# Patient Record
Sex: Female | Born: 1970 | Race: White | Hispanic: No | Marital: Married | Smoking: Never smoker
Health system: Southern US, Community
[De-identification: ages and names within clinical notes are randomized; demographics above are authoritative.]

---

## 2007-09-13 ENCOUNTER — Ambulatory Visit: Payer: Self-pay | Admitting: Internal Medicine

## 2015-05-09 ENCOUNTER — Other Ambulatory Visit: Payer: Self-pay | Admitting: Specialist

## 2015-05-22 ENCOUNTER — Other Ambulatory Visit: Payer: Self-pay

## 2015-05-22 ENCOUNTER — Ambulatory Visit: Payer: Self-pay

## 2015-05-25 ENCOUNTER — Ambulatory Visit
Admission: RE | Admit: 2015-05-25 | Discharge: 2015-05-25 | Disposition: A | Discharge status: Home / Self Care | Payer: BC Managed Care – PPO | Source: Ambulatory Visit | Attending: Specialist | Admitting: Specialist

## 2016-06-27 IMAGING — US US ABDOMEN LIMITED
1 series · 14 of 25 positions shown · non-contrast
Comparison: None.

CLINICAL DATA: Morbid obesity, evaluate gallbladder

EXAM:
US ABDOMEN LIMITED - RIGHT UPPER QUADRANT

[Series 1: us abdomen limited · 0.22mm/px · 14 of 48 slices shown]
[im 1/48]
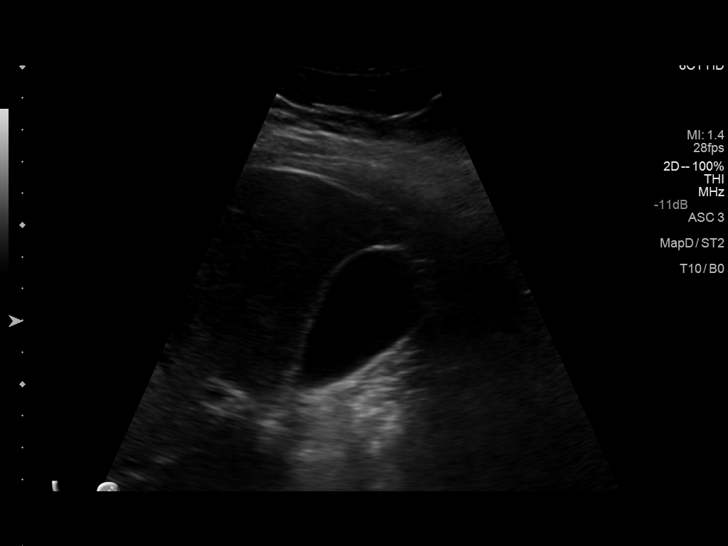
[im 4/48]
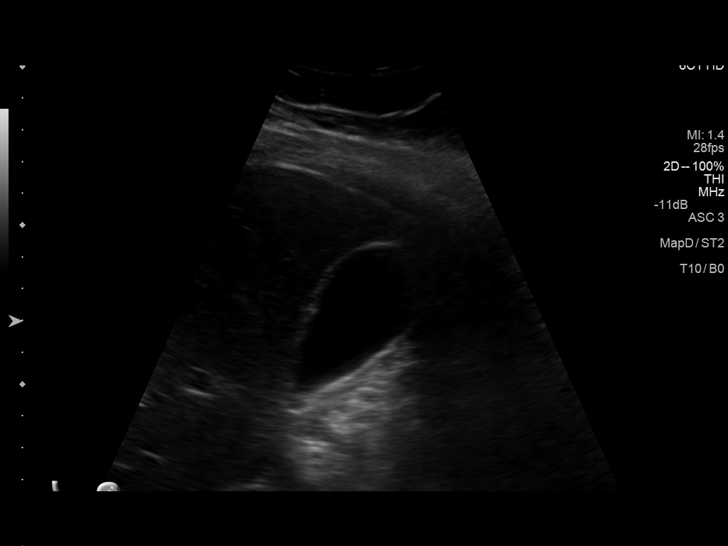
[im 8/48]
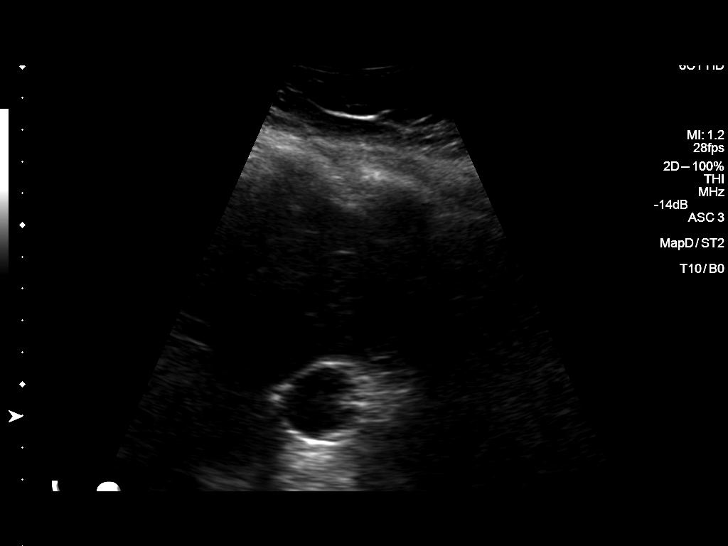
[im 12/48]
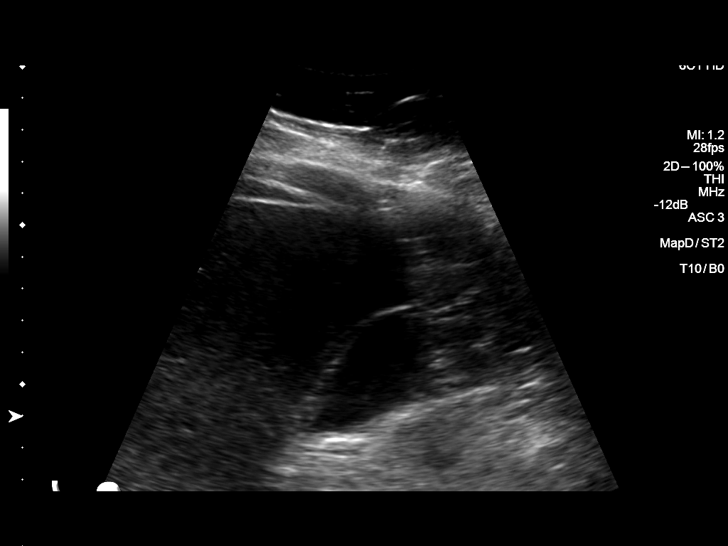
[im 16/48]
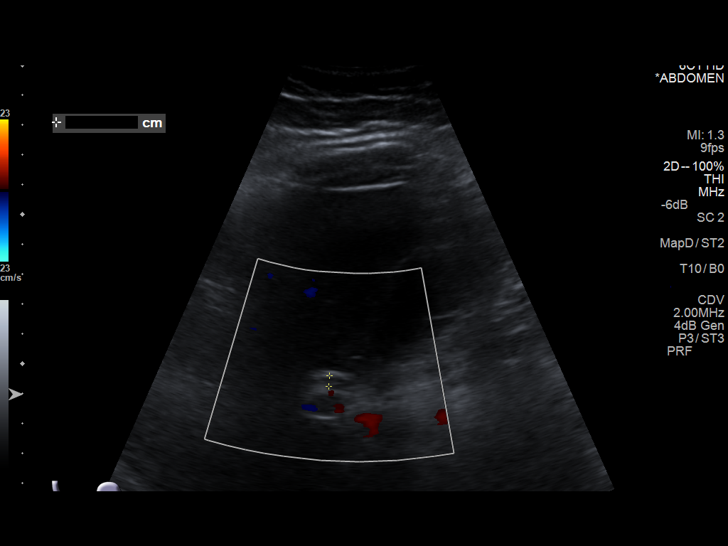
[im 18/48]
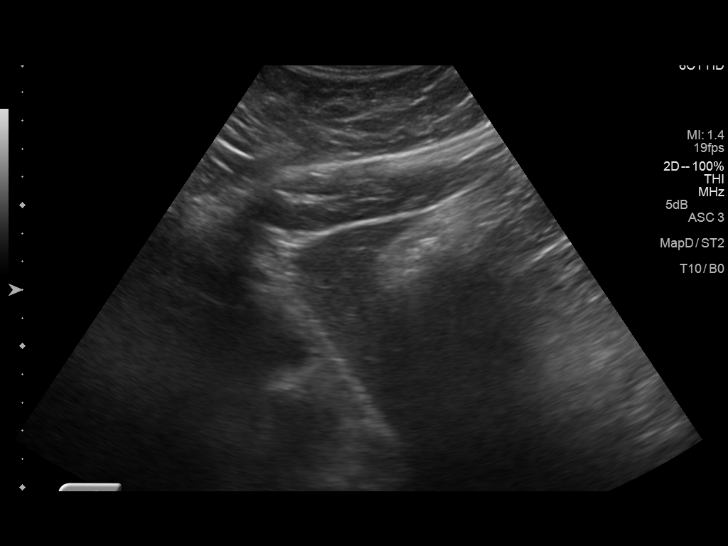
[im 22/48]
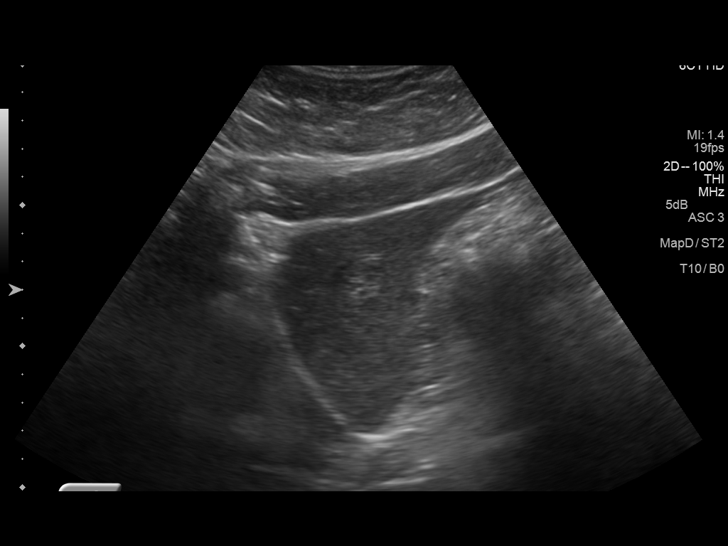
[im 26/48]
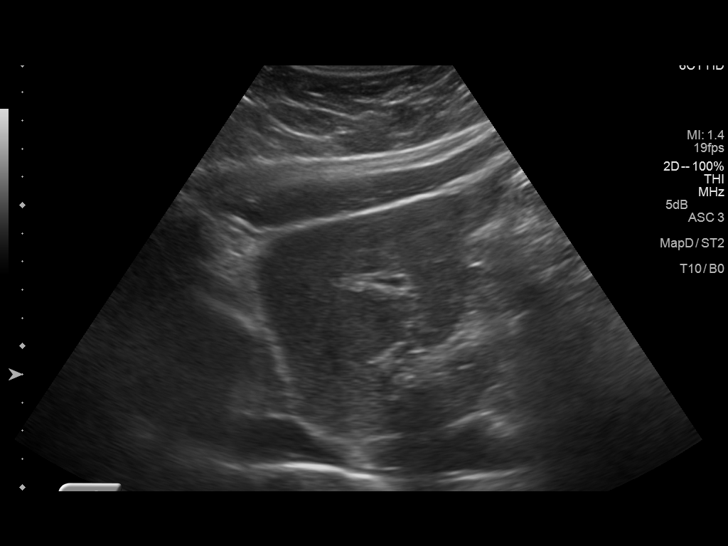
[im 30/48]
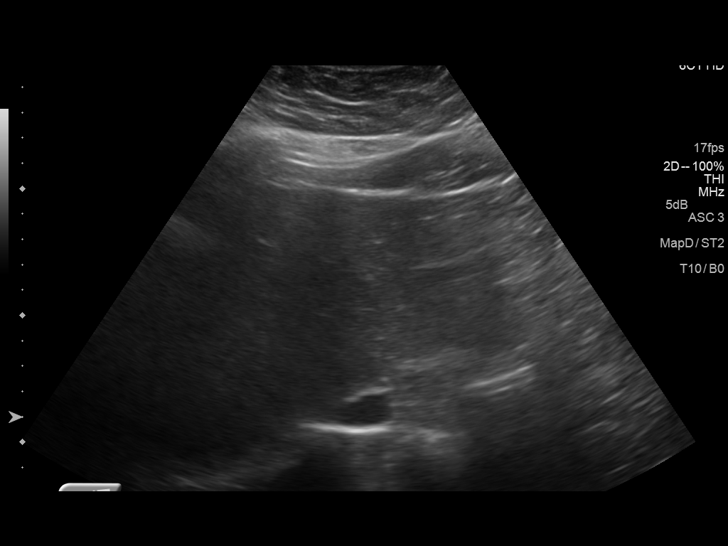
[im 32/48]
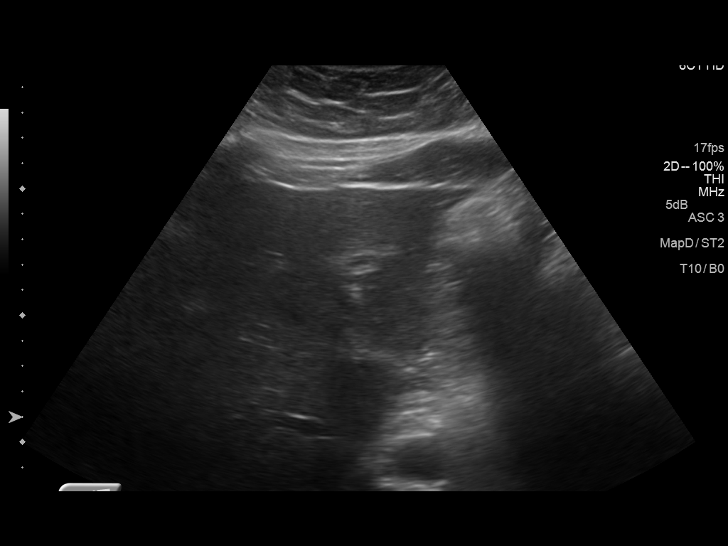
[im 36/48]
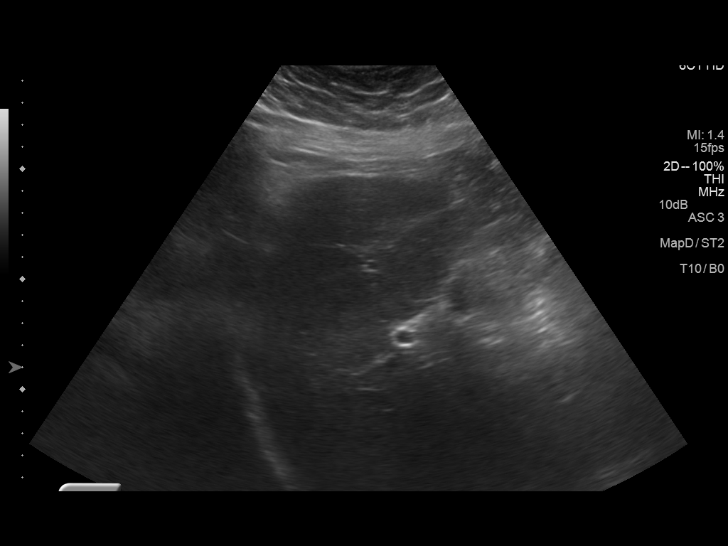
[im 40/48]
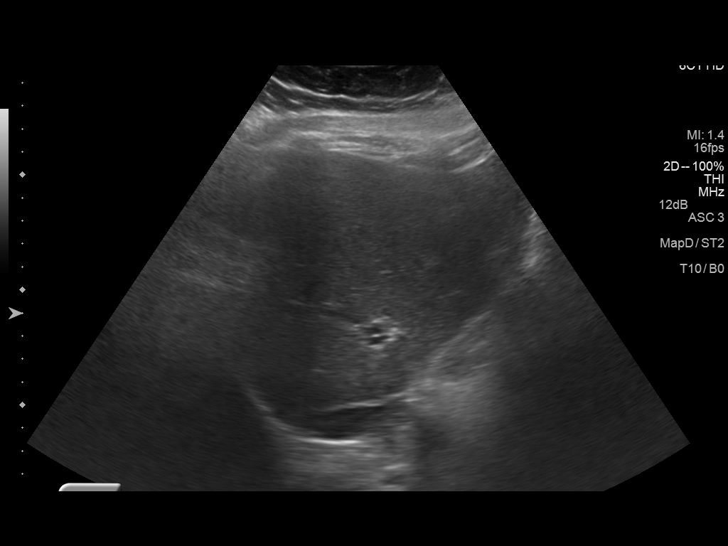
[im 44/48]
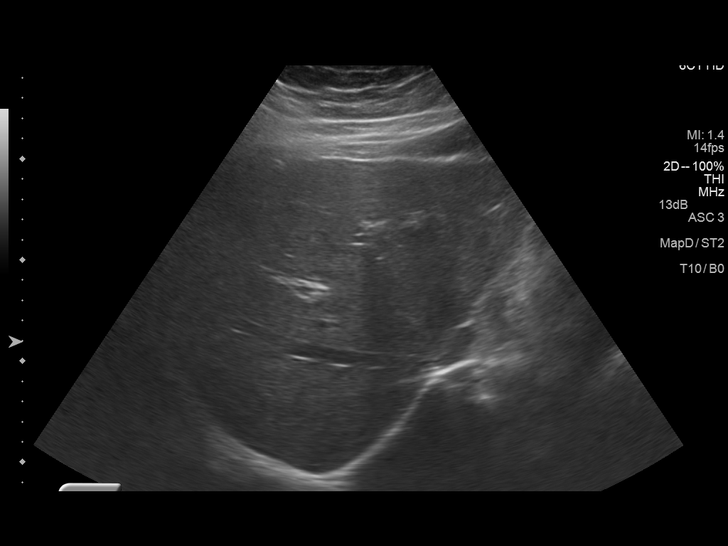
[im 48/48]
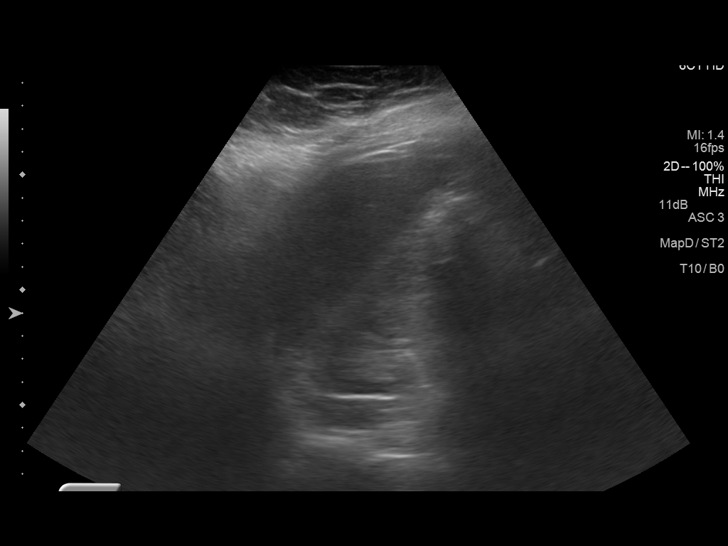

[14 of 25 positions shown; findings below may reference images not displayed]

FINDINGS: Gallbladder:

No gallstones clinical local, or pericholecystic fluid. Negative
sonographic Murphy's sign.

Common bile duct:

Diameter: 4 mm

Liver:

No focal lesion identified. Within normal limits in parenchymal
echogenicity.
IMPRESSION: Negative right upper quadrant ultrasound.

## 2016-07-08 IMAGING — CR DG CHEST 2V
1 series · 2 of 2 positions shown · non-contrast
Comparison: None.

CLINICAL DATA: Morbid obesity.  Bariatric workup.

EXAM:
CHEST - 2 VIEW

[Series 1: w chest pa · 0.14mm/px · 2 of 2 slices shown]
[im 1/2]
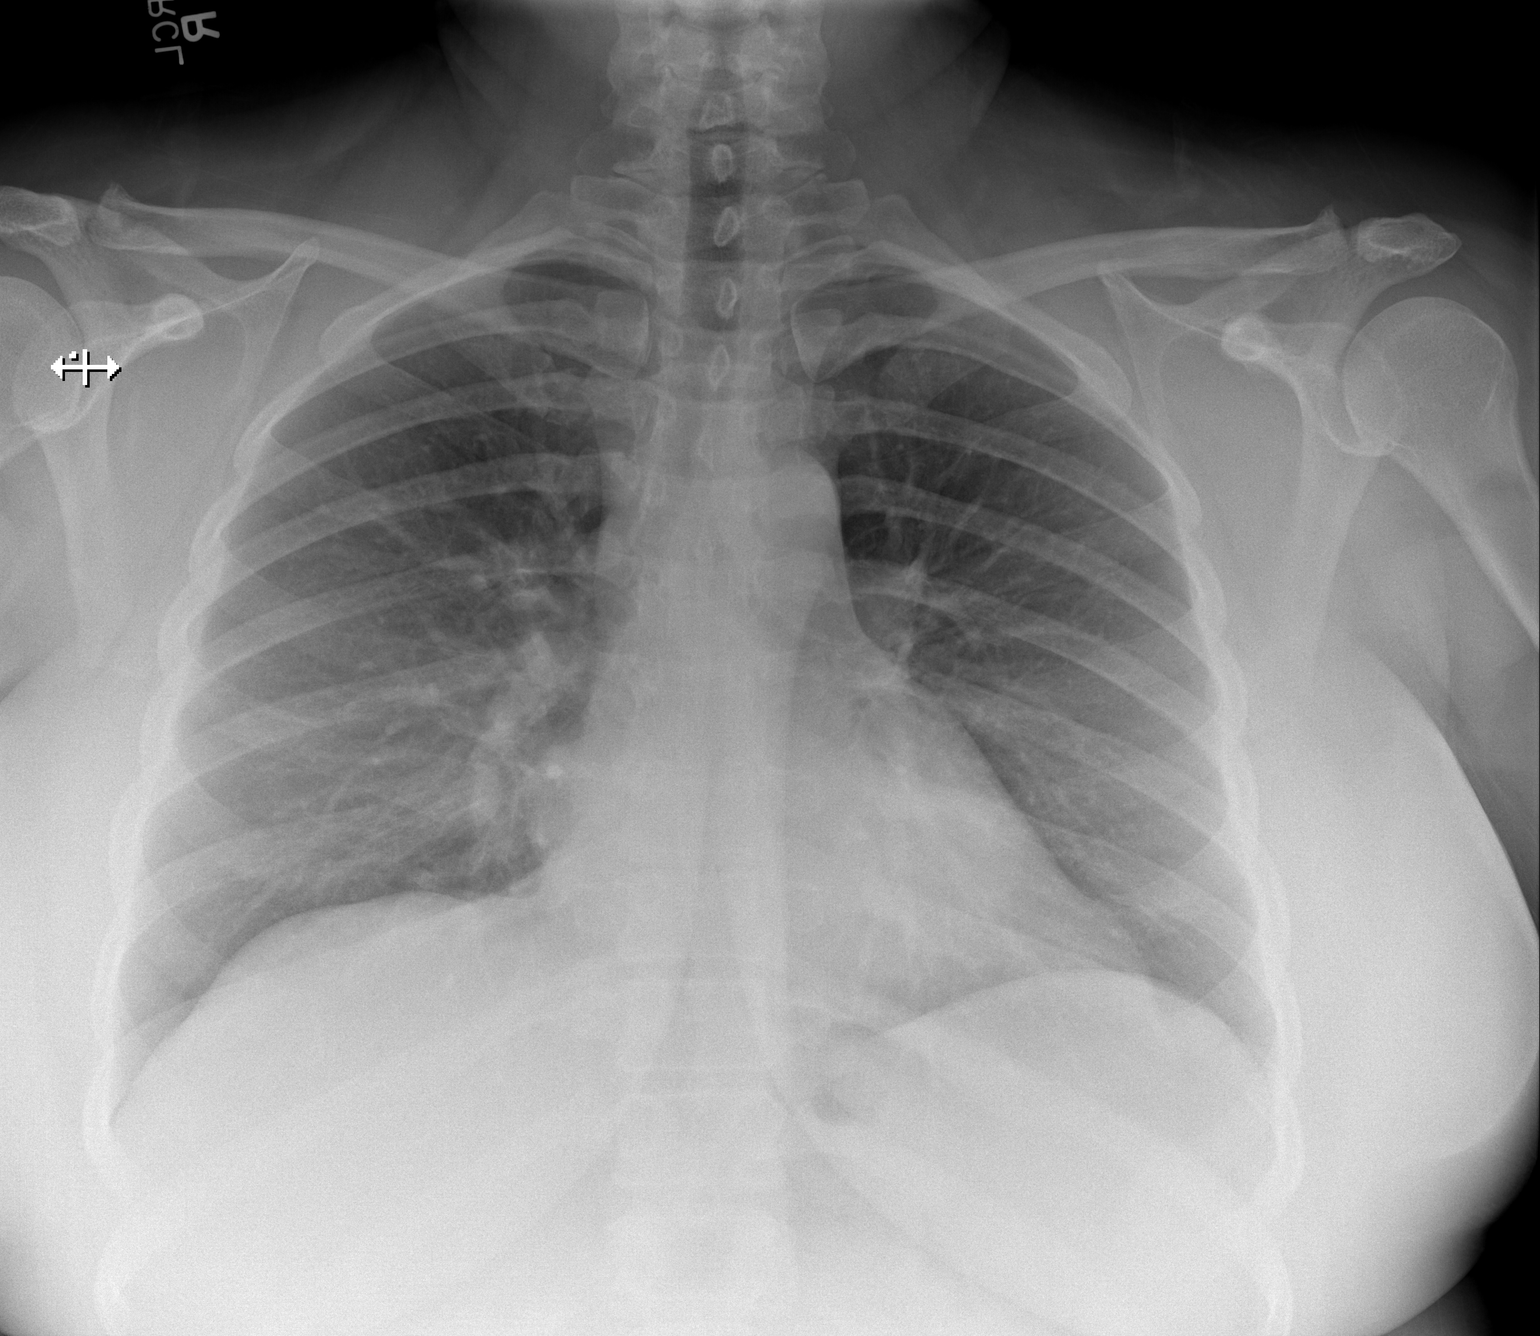
[im 2/2]
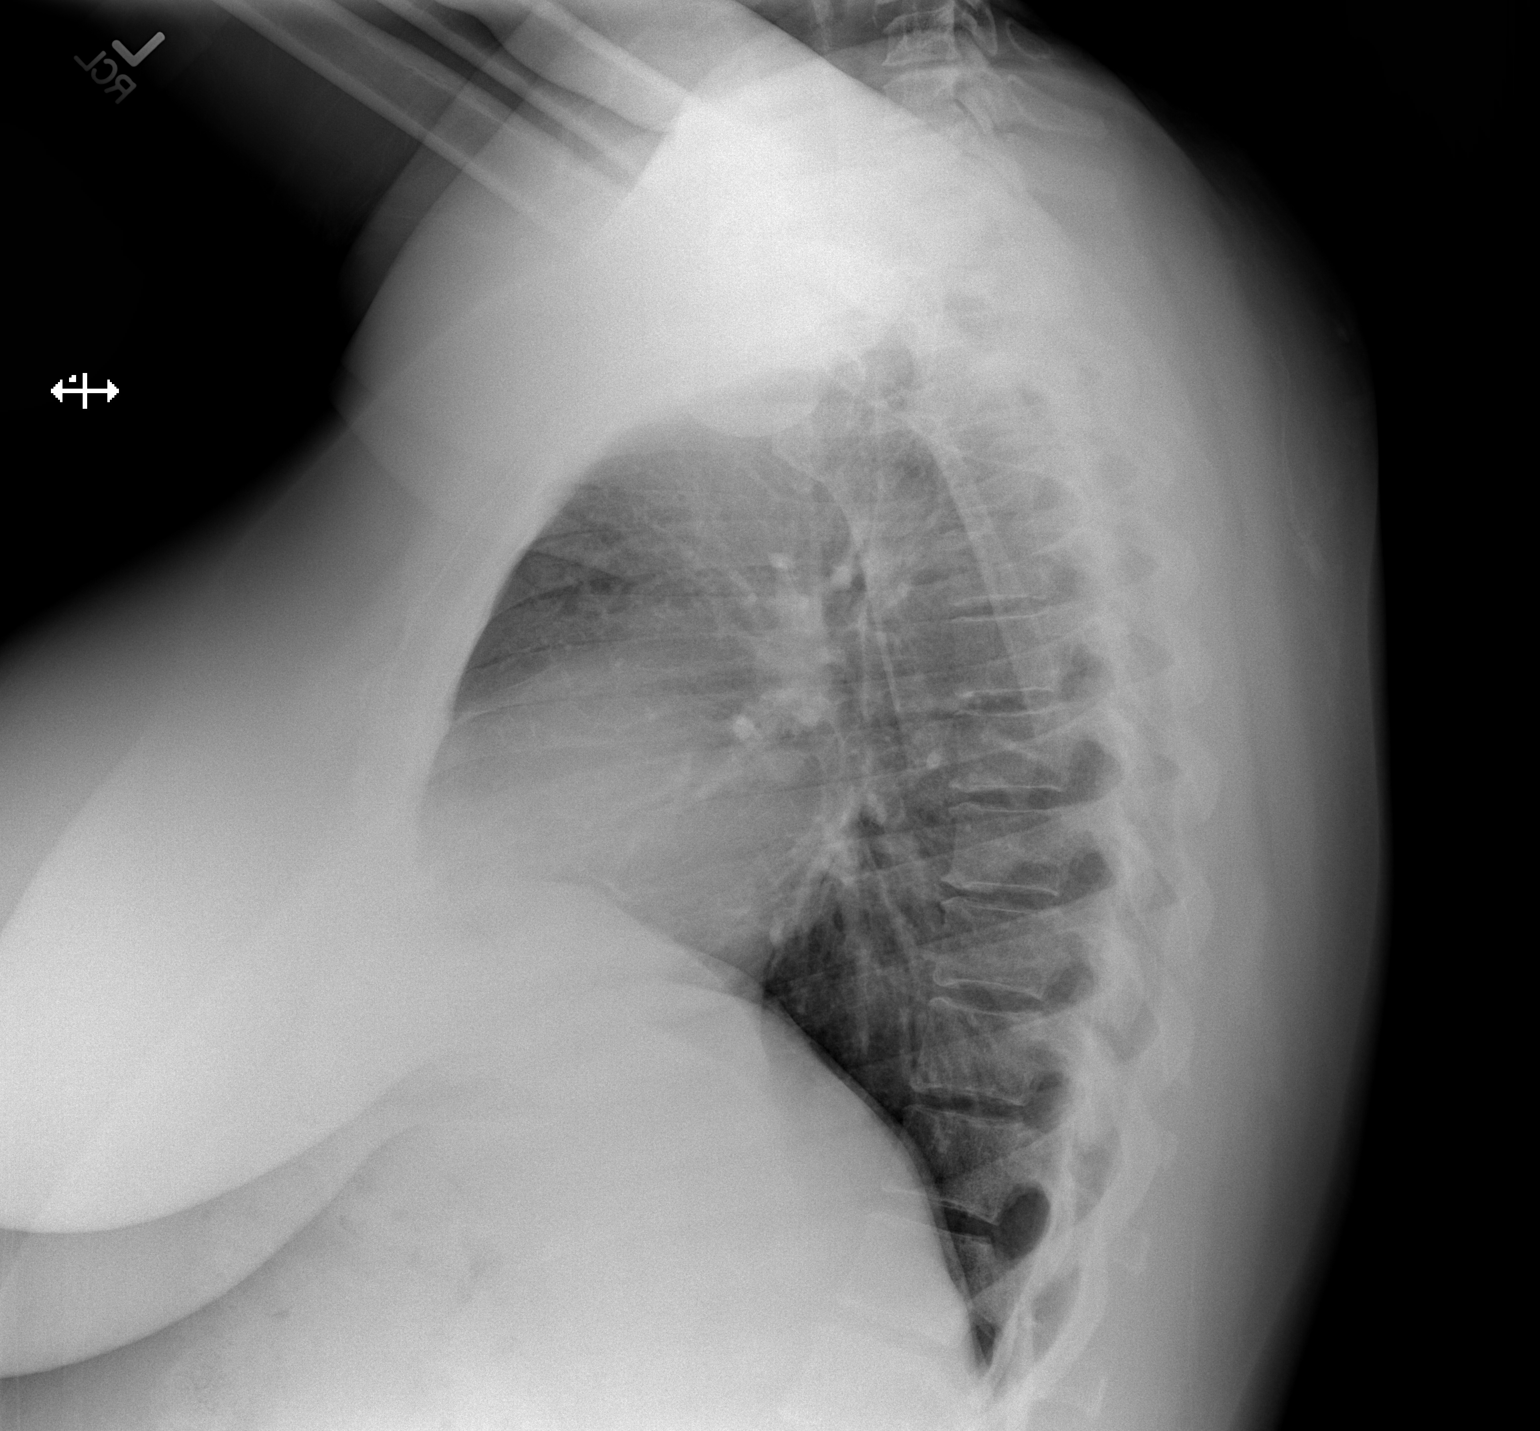

[2 of 2 positions shown; findings below may reference images not displayed]

FINDINGS: The heart size and mediastinal contours are within normal limits.
Both lungs are clear. The visualized skeletal structures are
unremarkable.
IMPRESSION: Negative two view chest x-ray

## 2020-02-12 ENCOUNTER — Ambulatory Visit: Payer: BC Managed Care – PPO | Attending: Internal Medicine

## 2020-02-12 DIAGNOSIS — Z23 Encounter for immunization: Secondary | ICD-10-CM

## 2020-02-12 NOTE — Progress Notes (Signed)
   Covid-19 Vaccination Clinic  Name:  Shari Santos    MRN: [id number] DOB: Jan 16, 1971  02/12/2020  [name] was observed post Covid-19 immunization for 15 minutes without incident. She was provided with Vaccine Information Sheet and instruction to access the V-Safe system.   [name] was instructed to call 911 with any severe reactions post vaccine: Marland Kitchen Difficulty breathing  . Swelling of face and throat  . A fast heartbeat  . A bad rash all over body  . Dizziness and weakness   Immunizations Administered    Name Date Dose VIS Date Route   Pfizer COVID-19 Vaccine 02/12/2020  5:48 PM 0.3 mL 11/04/2019 Intramuscular   Manufacturer: [employer], [employer]   Lot: [id number]   NDC: [address]-5

## 2020-03-04 ENCOUNTER — Ambulatory Visit: Payer: BC Managed Care – PPO | Attending: Internal Medicine

## 2020-03-04 DIAGNOSIS — Z23 Encounter for immunization: Secondary | ICD-10-CM

## 2020-03-04 NOTE — Progress Notes (Signed)
   Covid-19 Vaccination Clinic  Name:  Shari Santos    MRN: [id number] DOB: 1971/09/08  03/04/2020  [name] was observed post Covid-19 immunization for 15 minutes without incident. She was provided with Vaccine Information Sheet and instruction to access the V-Safe system.   [name] was instructed to call 911 with any severe reactions post vaccine: Marland Kitchen Difficulty breathing  . Swelling of face and throat  . A fast heartbeat  . A bad rash all over body  . Dizziness and weakness   Immunizations Administered    Name Date Dose VIS Date Route   Pfizer COVID-19 Vaccine 03/04/2020  4:58 PM 0.3 mL 11/04/2019 Intramuscular   Manufacturer: [employer], [employer]   Lot: [phone] [id number]   NDC: [address]-4

## 2024-10-21 ENCOUNTER — Ambulatory Visit: Admission: EM | Admit: 2024-10-21 | Discharge: 2024-10-21 | Disposition: A | Discharge status: Home / Self Care

## 2024-10-21 ENCOUNTER — Encounter: Payer: Self-pay | Admitting: Emergency Medicine

## 2024-10-21 DIAGNOSIS — H00014 Hordeolum externum left upper eyelid: Secondary | ICD-10-CM

## 2024-10-21 MED ORDER — ERYTHROMYCIN 5 MG/GM OP OINT: TOPICAL_OINTMENT | OPHTHALMIC | 0 refills | Status: AC

## 2024-10-21 NOTE — ED Notes (Signed)
 Pt discharged by provider before visual acuity was completed.

## 2024-10-21 NOTE — Discharge Instructions (Addendum)
Continue to apply warm compresses.  Perform eyelid hygiene with baby shampoo in a rich lather and vigorous scrubbing of your eyelids twice daily. Rinse thoroughly with water.  Apply Erythromycin ointment to the eyelid margin with a clean Q-tip twice daily, and a 1/2 inch ribbon to the inside of the lower lid. Blink several times to liquify the ointment and spread it ober the inside of your eyelids.  If your symptoms do not improve, or you develop changes in your vision follow up with your eye doctor.

## 2024-10-21 NOTE — ED Triage Notes (Signed)
 Pt reports left eye swelling and pain, started Tuesday evening, got worse on Wednesday and swelling  and itching has increased significantly.

## 2024-10-21 NOTE — ED Provider Notes (Signed)
 [facility]    CSN: [id number] Arrival date & time: 10/21/24  1052      History   Chief Complaint No chief complaint on file.   HPI Shari Santos is a 53 y.o. female.   HPI  53 year old female with no significant past medical history presents for evaluation of swelling and itchiness to her left upper eyelid that started 3 days ago.  She denies any matting of her eyelashes, eye redness or itching, or changes to her vision.  History reviewed. No pertinent past medical history.  There are no active problems to display for this patient.   History reviewed. No pertinent surgical history.  OB History   No obstetric history on file.      Home Medications    Prior to Admission medications   Medication Sig Start Date End Date Taking? Authorizing Provider  erythromycin ophthalmic ointment Place a 1/2 inch ribbon of ointment along the eyelash margin twice daily for the next week. 10/21/24  Yes Bernardino Ditch, NP  VIENVA 0.1-20 MG-MCG tablet Take 1 tablet by mouth daily.    [provider]    Family History History reviewed. No pertinent family history.  Social History Social History   Tobacco Use   Smoking status: Never   Smokeless tobacco: Never     Allergies   Patient has no known allergies.   Review of Systems Review of Systems  Eyes:  Positive for itching. Negative for photophobia, pain, discharge, redness and visual disturbance.     Physical Exam Triage Vital Signs ED Triage Vitals  Encounter Vitals Group     BP      Girls Systolic BP Percentile      Girls Diastolic BP Percentile      Boys Systolic BP Percentile      Boys Diastolic BP Percentile      Pulse      Resp      Temp      Temp src      SpO2      Weight      Height      Head Circumference      Peak Flow      Pain Score      Pain Loc      Pain Education      Exclude from Growth Chart    No data found.  Updated Vital Signs BP (!) 144/79 (BP  Location: Right Arm)   Pulse 79   Temp 98.8 F (37.1 C) (Oral)   Resp 20   LMP  (LMP Unknown)   SpO2 96%   Visual Acuity Right Eye Distance:   Left Eye Distance:   Bilateral Distance:    Right Eye Near:   Left Eye Near:    Bilateral Near:     Physical Exam Vitals and nursing note reviewed.  Constitutional:      Appearance: Normal appearance. She is not ill-appearing.  HENT:     Head: Normocephalic and atraumatic.  Eyes:     General:        Right eye: No discharge.        Left eye: No discharge.     Extraocular Movements: Extraocular movements intact.     Conjunctiva/sclera: Conjunctivae normal.     Pupils: Pupils are equal, round, and reactive to light.  Neurological:     Mental Status: She is alert.      UC Treatments / Results  Labs (all labs ordered are listed, but only abnormal  results are displayed) Labs Reviewed - No data to display  EKG   Radiology No results found.  Procedures Procedures (including critical care time)  Medications Ordered in UC Medications - No data to display  Initial Impression / Assessment and Plan / UC Course  I have reviewed the triage vital signs and the nursing notes.  Pertinent labs & imaging results that were available during my care of the patient were reviewed by me and considered in my medical decision making (see chart for details).   Patient is a pleasant, nontoxic-appearing 53 year old female presenting for evaluation of a possible stye to her left upper eyelid.  She can see the image above, the patient does have erythema and edema to the upper eyelid, primarily focused in the central aspect.  I do not notice any external lesions or any lesions internally on the conjunctiva.  I suspect that this is an early stye.  I will have her continue warm compresses, do vigorous eyelid hygiene, and apply erythromycin eye ointment twice daily to her eyelash margin for the next week.  If her symptoms do not improve she is to  follow-up with ophthalmology.   Final Clinical Impressions(s) / UC Diagnoses   Final diagnoses:  Hordeolum externum of left upper eyelid     Discharge Instructions      Continue to apply warm compresses.  Perform eyelid hygiene with baby shampoo in a rich lather and vigorous scrubbing of your eyelids twice daily. Rinse thoroughly with water.  Apply Erythromycin ointment to the eyelid margin with a clean Q-tip twice daily, and a 1/2 inch ribbon to the inside of the lower lid. Blink several times to liquify the ointment and spread it ober the inside of your eyelids.  If your symptoms do not improve, or you develop changes in your vision follow up with your eye doctor.       ED Prescriptions     Medication Sig Dispense Auth. Provider   erythromycin ophthalmic ointment Place a 1/2 inch ribbon of ointment along the eyelash margin twice daily for the next week. 3.5 g Bernardino Ditch, NP      PDMP not reviewed this encounter.   Bernardino Ditch, NP 10/21/24 1158
